# Patient Record
Sex: Female | Born: 1975 | Race: Black or African American | Hispanic: No | Marital: Married | Smoking: Never smoker
Health system: Southern US, Community
[De-identification: ages and names within clinical notes are randomized; demographics above are authoritative.]

## PROBLEM LIST (undated history)

## (undated) DIAGNOSIS — K529 Noninfective gastroenteritis and colitis, unspecified: Secondary | ICD-10-CM

## (undated) HISTORY — DX: Noninfective gastroenteritis and colitis, unspecified: K52.9

---

## 2013-03-12 ENCOUNTER — Other Ambulatory Visit: Payer: Self-pay | Admitting: Obstetrics and Gynecology

## 2013-03-12 DIAGNOSIS — R928 Other abnormal and inconclusive findings on diagnostic imaging of breast: Secondary | ICD-10-CM

## 2013-03-27 ENCOUNTER — Ambulatory Visit
Admission: RE | Admit: 2013-03-27 | Discharge: 2013-03-27 | Disposition: A | Discharge status: Home / Self Care | Payer: 59 | Source: Ambulatory Visit | Attending: Obstetrics and Gynecology | Admitting: Obstetrics and Gynecology

## 2013-03-27 DIAGNOSIS — R928 Other abnormal and inconclusive findings on diagnostic imaging of breast: Secondary | ICD-10-CM

## 2013-04-08 ENCOUNTER — Other Ambulatory Visit: Payer: Self-pay | Admitting: Obstetrics and Gynecology

## 2013-04-08 DIAGNOSIS — R922 Inconclusive mammogram: Secondary | ICD-10-CM

## 2013-10-26 ENCOUNTER — Emergency Department (HOSPITAL_COMMUNITY): Payer: 59

## 2013-10-26 ENCOUNTER — Emergency Department (HOSPITAL_COMMUNITY)
Admission: EM | Admit: 2013-10-26 | Discharge: 2013-10-26 | Discharge status: Against Medical Advice | Payer: 59 | Attending: Emergency Medicine | Admitting: Emergency Medicine

## 2013-10-26 DIAGNOSIS — R1012 Left upper quadrant pain: Secondary | ICD-10-CM

## 2013-10-26 DIAGNOSIS — S3981XA Other specified injuries of abdomen, initial encounter: Secondary | ICD-10-CM | POA: Insufficient documentation

## 2013-10-26 DIAGNOSIS — Y9389 Activity, other specified: Secondary | ICD-10-CM | POA: Insufficient documentation

## 2013-10-26 DIAGNOSIS — M549 Dorsalgia, unspecified: Secondary | ICD-10-CM

## 2013-10-26 DIAGNOSIS — IMO0002 Reserved for concepts with insufficient information to code with codable children: Secondary | ICD-10-CM | POA: Insufficient documentation

## 2013-10-26 DIAGNOSIS — S298XXA Other specified injuries of thorax, initial encounter: Secondary | ICD-10-CM | POA: Insufficient documentation

## 2013-10-26 DIAGNOSIS — Y92410 Unspecified street and highway as the place of occurrence of the external cause: Secondary | ICD-10-CM | POA: Insufficient documentation

## 2013-10-26 LAB — CBC
HCT: 34.9 % — ABNORMAL LOW (ref 36.0–46.0)
HEMOGLOBIN: 11.8 g/dL — AB (ref 12.0–15.0)
MCH: 26.2 pg (ref 26.0–34.0)
MCHC: 33.8 g/dL (ref 30.0–36.0)
MCV: 77.6 fL — AB (ref 78.0–100.0)
Platelets: 289 10*3/uL (ref 150–400)
RBC: 4.5 MIL/uL (ref 3.87–5.11)
RDW: 13.5 % (ref 11.5–15.5)
WBC: 6.9 10*3/uL (ref 4.0–10.5)
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
## 2013-10-26 NOTE — ED Notes (Signed)
Lab unable to run Lab tube, went in to draw another tube, patient refused, wanting to leave AMA. PA aware
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
## 2013-10-30 NOTE — ED Provider Notes (Signed)
Medical screening examination/treatment/procedure(s) were performed by non-physician practitioner and as supervising physician I was immediately available for consultation/collaboration.   Shoshanna Mcquitty M Lunna Vogelgesang, MD 10/30/13 2215 

## 2013-10-30 NOTE — ED Provider Notes (Signed)
Medical screening examination/treatment/procedure(s) were performed by non-physician practitioner and as supervising physician I was immediately available for consultation/collaboration.   Hurman HornJohn M Shritha Bresee, MD 10/30/13 2215

## 2014-02-24 ENCOUNTER — Emergency Department (HOSPITAL_COMMUNITY)
Admission: EM | Admit: 2014-02-24 | Discharge: 2014-02-24 | Disposition: A | Discharge status: Home / Self Care | Payer: 59 | Attending: Emergency Medicine | Admitting: Emergency Medicine

## 2014-02-24 ENCOUNTER — Emergency Department (HOSPITAL_COMMUNITY): Payer: 59

## 2014-02-24 ENCOUNTER — Encounter (HOSPITAL_COMMUNITY): Payer: Self-pay | Admitting: Emergency Medicine

## 2014-02-24 DIAGNOSIS — Z3202 Encounter for pregnancy test, result negative: Secondary | ICD-10-CM | POA: Insufficient documentation

## 2014-02-24 DIAGNOSIS — K529 Noninfective gastroenteritis and colitis, unspecified: Secondary | ICD-10-CM

## 2014-02-24 DIAGNOSIS — R109 Unspecified abdominal pain: Secondary | ICD-10-CM | POA: Insufficient documentation

## 2014-02-24 DIAGNOSIS — K5289 Other specified noninfective gastroenteritis and colitis: Secondary | ICD-10-CM | POA: Diagnosis not present

## 2014-02-24 LAB — COMPREHENSIVE METABOLIC PANEL
ALT: 14 U/L (ref 0–35)
AST: 29 U/L (ref 0–37)
Albumin: 4.3 g/dL (ref 3.5–5.2)
Alkaline Phosphatase: 43 U/L (ref 39–117)
Anion gap: 13 (ref 5–15)
BILIRUBIN TOTAL: 0.5 mg/dL (ref 0.3–1.2)
BUN: 12 mg/dL (ref 6–23)
CHLORIDE: 103 meq/L (ref 96–112)
CO2: 23 meq/L (ref 19–32)
Calcium: 9.4 mg/dL (ref 8.4–10.5)
Creatinine, Ser: 0.74 mg/dL (ref 0.50–1.10)
Glucose, Bld: 120 mg/dL — ABNORMAL HIGH (ref 70–99)
Potassium: 5.1 mEq/L (ref 3.7–5.3)
Sodium: 139 mEq/L (ref 137–147)
Total Protein: 7.4 g/dL (ref 6.0–8.3)

## 2014-02-24 LAB — URINALYSIS, ROUTINE W REFLEX MICROSCOPIC
BILIRUBIN URINE: NEGATIVE
GLUCOSE, UA: NEGATIVE mg/dL
HGB URINE DIPSTICK: NEGATIVE
KETONES UR: NEGATIVE mg/dL
Leukocytes, UA: NEGATIVE
Nitrite: NEGATIVE
PROTEIN: NEGATIVE mg/dL
Specific Gravity, Urine: 1.01 (ref 1.005–1.030)
Urobilinogen, UA: 0.2 mg/dL (ref 0.0–1.0)
pH: 7.5 (ref 5.0–8.0)

## 2014-02-24 LAB — CBC WITH DIFFERENTIAL/PLATELET
Basophils Absolute: 0 10*3/uL (ref 0.0–0.1)
Basophils Relative: 0 % (ref 0–1)
EOS PCT: 0 % (ref 0–5)
Eosinophils Absolute: 0 10*3/uL (ref 0.0–0.7)
HEMATOCRIT: 36.4 % (ref 36.0–46.0)
Hemoglobin: 12 g/dL (ref 12.0–15.0)
LYMPHS PCT: 12 % (ref 12–46)
Lymphs Abs: 1.1 10*3/uL (ref 0.7–4.0)
MCH: 25.5 pg — ABNORMAL LOW (ref 26.0–34.0)
MCHC: 33 g/dL (ref 30.0–36.0)
MCV: 77.3 fL — AB (ref 78.0–100.0)
MONO ABS: 0.3 10*3/uL (ref 0.1–1.0)
Monocytes Relative: 4 % (ref 3–12)
NEUTROS ABS: 7.8 10*3/uL — AB (ref 1.7–7.7)
Neutrophils Relative %: 84 % — ABNORMAL HIGH (ref 43–77)
Platelets: 283 10*3/uL (ref 150–400)
RBC: 4.71 MIL/uL (ref 3.87–5.11)
RDW: 13.5 % (ref 11.5–15.5)
WBC: 9.3 10*3/uL (ref 4.0–10.5)

## 2014-02-24 LAB — I-STAT TROPONIN, ED: Troponin i, poc: 0 ng/mL (ref 0.00–0.08)

## 2014-02-24 LAB — PREGNANCY, URINE: Preg Test, Ur: NEGATIVE

## 2014-02-24 LAB — LIPASE, BLOOD: LIPASE: 23 U/L (ref 11–59)

## 2014-02-24 MED ORDER — OXYCODONE-ACETAMINOPHEN 5-325 MG PO TABS: 1.0000 | ORAL_TABLET | ORAL | Status: DC | PRN

## 2014-02-24 MED ORDER — IOHEXOL 300 MG/ML  SOLN
50.0000 mL | Freq: Once | INTRAMUSCULAR | Status: AC | PRN
  Administered 2014-02-24: 50 mL via ORAL

## 2014-02-24 MED ORDER — METRONIDAZOLE 500 MG PO TABS: 500.0000 mg | ORAL_TABLET | Freq: Two times a day (BID) | ORAL | Status: DC

## 2014-02-24 MED ORDER — IOHEXOL 300 MG/ML  SOLN
100.0000 mL | Freq: Once | INTRAMUSCULAR | Status: AC | PRN
  Administered 2014-02-24: 100 mL via INTRAVENOUS

## 2014-02-24 MED ORDER — ONDANSETRON 8 MG PO TBDP: 8.0000 mg | ORAL_TABLET | Freq: Three times a day (TID) | ORAL | Status: DC | PRN

## 2014-02-24 MED ORDER — ONDANSETRON HCL 4 MG/2ML IJ SOLN
4.0000 mg | Freq: Once | INTRAMUSCULAR | Status: AC
  Administered 2014-02-24: 4 mg via INTRAVENOUS
  Filled 2014-02-24: qty 2

## 2014-02-24 MED ORDER — MORPHINE SULFATE 4 MG/ML IJ SOLN
6.0000 mg | Freq: Once | INTRAMUSCULAR | Status: AC
  Administered 2014-02-24: 6 mg via INTRAVENOUS
  Filled 2014-02-24: qty 2

## 2014-02-24 NOTE — ED Notes (Signed)
Pt c/o abd pain "like i have to use the bath room but nothing comes out".  Pt states she had small BM this morning, that was formed and loose.  Pt been having the problems off and on for a while.

## 2014-02-24 NOTE — ED Provider Notes (Signed)
CSN: [id number]     Arrival date & time 02/24/14  0801 History   First MD Initiated Contact with Patient 02/24/14 0809     Chief Complaint  Patient presents with  . Abdominal Pain      HPI Patient presents to the emergency department complaining of intermittent abdominal pain over the past 6 months.  She states that her pain comes on quickly and feels like a crampy-type pain is located mainly in the periumbilical and left-sided regions of her abdomen.  It is sometimes associated with nausea and vomiting.  She's had an abdominal hysterectomy but reports no other abdominal surgeries.  She states this usually occurs more in the morning time.  Her husband reports that she can't be thought of her diet several months ago and this seemed to help for a little while but now her pain is resolved.  No prior history of gallstones.  Denies fevers or chills.  She is in her normal state of health this morning when she awoke and the pain became rather severe shortly after awakening.  She denies urinary symptoms.  She feels like she is somewhat constipated and a stroke with constipation before.  Denies chest pain or shortness breath.  Denies back pain.  No pelvic complaints.  Pain is moderate to severe in severity at this time.  During the time of the history the pain seemed to increase in severity while we are talking and was focused in the left side of her abdomen.   History reviewed. No pertinent past medical history. History reviewed. No pertinent past surgical history. No family history on file. History  Substance Use Topics  . Smoking status: Never Smoker   . Smokeless tobacco: Not on file  . Alcohol Use: No   OB History   Grav Para Term Preterm Abortions TAB SAB Ect Mult Living                 Review of Systems  All other systems reviewed and are negative.     Allergies  Review of patient's allergies indicates no known allergies.  Home Medications   Prior to Admission medications   Not  on File   BP 125/80  Pulse 74  Temp(Src) 97.9 F (36.6 C) (Oral)  Resp 19  SpO2 100%  LMP 02/17/2014 Physical Exam  Nursing note and vitals reviewed. Constitutional: She is oriented to person, place, and time. She appears well-developed and well-nourished. No distress.  Uncomfortable appearing  HENT:  Head: Normocephalic and atraumatic.  Eyes: EOM are normal.  Neck: Normal range of motion.  Cardiovascular: Normal rate, regular rhythm and normal heart sounds.   Pulmonary/Chest: Effort normal and breath sounds normal.  Abdominal: Soft. She exhibits no distension.  Mild left-sided abdominal tenderness without guarding or rebound.  No right upper quadrant tenderness on examination.  No right lower quadrant tenderness on examination.  Musculoskeletal: Normal range of motion.  Neurological: She is alert and oriented to person, place, and time.  Skin: Skin is warm and dry.  Psychiatric: She has a normal mood and affect. Judgment normal.    ED Course  Procedures (including critical care time) Labs Review Labs Reviewed  CBC WITH DIFFERENTIAL - Abnormal; Notable for the following:    MCV 77.3 (*)    MCH 25.5 (*)    Neutrophils Relative % 84 (*)    Neutro Abs 7.8 (*)    All other components within normal limits  COMPREHENSIVE METABOLIC PANEL - Abnormal; Notable for the following:  Glucose, Bld 120 (*)    All other components within normal limits  LIPASE, BLOOD  URINALYSIS, ROUTINE W REFLEX MICROSCOPIC  PREGNANCY, URINE  I-STAT TROPOININ, ED    Imaging Review Ct Abdomen Pelvis W Contrast  02/24/2014   CLINICAL DATA:  Abdominal pain intermittent 6 months. Worsening today  EXAM: EXAM  CT ABDOMEN AND PELVIS WITH CONTRAST  TECHNIQUE: Multidetector CT imaging of the abdomen and pelvis was performed using the standard protocol following bolus administration of intravenous contrast.  CONTRAST:  50mL OMNIPAQUE IOHEXOL 300 MG/ML SOLN, OMNIPAQUE IOHEXOL 300 MG/ML SOLN  COMPARISON:   None.  FINDINGS: Lung bases are clear.  No pericardial fluid.  There several sub cm low-attenuation lesions the dome the liver too small to characterize likely represent small cysts or hemangiomas. Portal veins are patent. Gallbladder is normal.  The pancreas, spleen, adrenal glands, kidneys are normal.  The stomach, small bowel, and cecum are normal. Appendix is normal. There is mild bowel wall thickening / edema of the descending colon and sigmoid colon. For example on axial image 50, series 2 the bowel wall of descending colon is thickened at 3 mm. Mildly thickened sigmoid colon is seen on image 61 series 5. There is no free fluid matter pelvis.  Abdominal aorta is normal caliber. No retroperitoneal periportal lymphadenopathy  No free fluid the pelvis. The uterus and ovaries normal. No pelvic lymphadenopathy the. No aggressive osseous lesion.  IMPRESSION: 1. Mild bowel wall thickening / edema in the descending colon and sigmoid colon is nonspecific but may represent mild colitis with differential including inflammatory bowel disease or infectious or inflammatory colitis. 2. Normal appendix and gallbladder. 3. Small hepatic hypodensities cannot be fully characterized but likely represent benign lesions.   Electronically Signed   By: Genevive Bi M.D.   On: 02/24/2014 10:03     EKG Interpretation None      MDM   Final diagnoses:  Colitis    10:35 AM Patient feels better at this time.  CT scan appears to have signs of colitis.  Patient be placed on Flagyl.  Outpatient PCP followup.  Referral to gastroenterology.  She will likely need colonoscopy in the near future to further evaluate.  She understands to return to the ER for new or worsening symptoms    Lyanne Co, MD 02/24/14 1035

## 2014-02-24 NOTE — ED Notes (Signed)
Pt reports intermittent lower abdominal pain for 6 months starting/worsening again this am around 0530. Pt reports normal BM this morning, one episode of vomiting. Pt denies blood in vomit or stool. Pt denies GU complaint. Pt denies upper abdominal pain, CP, or SOB.

## 2014-02-24 NOTE — Discharge Instructions (Signed)

## 2014-02-25 ENCOUNTER — Encounter: Payer: Self-pay | Admitting: Gastroenterology

## 2014-04-23 ENCOUNTER — Telehealth: Payer: Self-pay

## 2014-04-23 ENCOUNTER — Encounter: Payer: Self-pay | Admitting: Gastroenterology

## 2014-04-23 ENCOUNTER — Ambulatory Visit (INDEPENDENT_AMBULATORY_CARE_PROVIDER_SITE_OTHER): Payer: 59 | Admitting: Gastroenterology

## 2014-04-23 VITALS — BP 118/70 | HR 74 | Ht 67.0 in | Wt 197.2 lb

## 2014-04-23 DIAGNOSIS — R933 Abnormal findings on diagnostic imaging of other parts of digestive tract: Secondary | ICD-10-CM

## 2014-04-23 DIAGNOSIS — R109 Unspecified abdominal pain: Secondary | ICD-10-CM

## 2014-04-23 DIAGNOSIS — K5900 Constipation, unspecified: Secondary | ICD-10-CM

## 2014-04-23 MED ORDER — MOVIPREP 100 G PO SOLR: ORAL | Status: DC

## 2014-04-23 MED ORDER — HYOSCYAMINE SULFATE 0.125 MG PO TABS: ORAL_TABLET | ORAL | Status: AC

## 2014-04-23 NOTE — Patient Instructions (Signed)
You have been scheduled for a colonoscopy. Please follow written instructions given to you at your visit today.  Please pick up your prep kit at the pharmacy within the next 1-3 days. If you use inhalers (even only as needed), please bring them with you on the day of your procedure. Your physician has requested that you go to www.startemmi.com and enter the access code given to you at your visit today. This web site gives a general overview about your procedure. However, you should still follow specific instructions given to you by our office regarding your preparation for the procedure.  Use your miralax one to two times a day.   I appreciate the opportunity to care for you.

## 2014-04-23 NOTE — Progress Notes (Signed)
    History of Present Illness: This is a 38 year old female who relates mild constipation and episodic generalized crampy abdominal pain intermittently for the past several months. She had one episode that was quite severe and presented to the ED on August 31. Blood work and an abdominal/pelvic CT scan were performed and were unremarkable except for microcytosis and the subtle CT findings outlined below. Denies weight loss, diarrhea, change in stool caliber, melena, hematochezia, nausea, vomiting, dysphagia, reflux symptoms, chest pain.  IMPRESSION:  1. Mild bowel wall thickening / edema in the descending colon and sigmoid colon is nonspecific but may represent mild colitis with differential including inflammatory bowel disease or infectious or inflammatory colitis.  2. Normal appendix and gallbladder.  3. Small hepatic hypodensities cannot be fully characterized but likely represent benign lesions.    Review of Systems: Pertinent positive and negative review of systems were noted in the above HPI section. All other review of systems were otherwise negative.   Physical Exam: General: Well developed , well nourished, no acute distress Head: Normocephalic and atraumatic Eyes:  sclerae anicteric, EOMI Ears: Normal auditory acuity Mouth: No deformity or lesions Neck: Supple, no masses or thyromegaly Lungs: Clear throughout to auscultation Heart: Regular rate and rhythm; no murmurs, rubs or bruits Abdomen: Soft, non tender and non distended. No masses, hepatosplenomegaly or hernias noted. Normal Bowel sounds Rectal: Deferred to colonoscopy Musculoskeletal: Symmetrical with no gross deformities  Skin: No lesions on visible extremities Pulses:  Normal pulses noted Extremities: No clubbing, cyanosis, edema or deformities noted Neurological: Alert oriented x 4, grossly nonfocal Cervical Nodes:  No significant cervical adenopathy Inguinal Nodes: No significant inguinal adenopathy Psychological:   Alert and cooperative. Normal mood and affect  Assessment and Recommendations:  1. Episodic generalized abdominal pain associated with constipation. Mildly thickened left colon on CT scan which may be an artifact. 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 Rule out IBD. MiraLAX once or twice daily titrated for adequate bowel movements. Levsin 1-2 every 4 hours as needed for abdominal pain. Maintain a high-fiber diet with adequate daily fluid intake. Schedule colonoscopy. The risks, benefits, and alternatives to colonoscopy with possible biopsy and possible polypectomy were discussed with the patient and they consent to proceed.

## 2014-04-23 NOTE — Telephone Encounter (Signed)
Informed patient that rx sent in for her abdominal pain, confirmed pharmacy.

## 2014-04-23 NOTE — Telephone Encounter (Signed)
Message copied by SwazilandJORDAN, Nishaan Stanke E on Wed Apr 23, 2014  5:22 PM ------      Message from: Claudette HeadSTARK, MALCOLM T      Created: Wed Apr 23, 2014  5:05 PM       Levsin 0.125 mg po 1-2 every 4 hours as needed for abdominal pain. ------

## 2014-04-28 ENCOUNTER — Encounter: Payer: Self-pay | Admitting: Gastroenterology

## 2014-04-28 NOTE — Progress Notes (Signed)
7 pages of FMLA paperwork has been sent to [employer] to be filled out.

## 2014-04-30 ENCOUNTER — Telehealth: Payer: Self-pay | Admitting: Gastroenterology

## 2014-04-30 NOTE — Telephone Encounter (Signed)
Patient is provided a [employer] voucher she will come and pick that up.  She is advised that she will need to have the FMLA paperwork done with her primary care

## 2014-05-05 ENCOUNTER — Encounter: Payer: Self-pay | Admitting: Gastroenterology

## 2014-05-05 NOTE — Progress Notes (Signed)
We have received 7 pages of FMLA forms from [employer] on the above patient. These forms have been sent to [employer].

## 2014-05-07 ENCOUNTER — Telehealth: Payer: Self-pay | Admitting: Gastroenterology

## 2014-05-07 NOTE — Telephone Encounter (Signed)
I have explained that the paperwork was [facility] on Monday 05/05/14 and that once they do their part they will send it back up to Dr. Russella DarStark to review and sign

## 2014-05-08 ENCOUNTER — Encounter: Payer: Self-pay | Admitting: Gastroenterology

## 2014-05-08 ENCOUNTER — Ambulatory Visit (AMBULATORY_SURGERY_CENTER): Payer: 59 | Admitting: Gastroenterology

## 2014-05-08 VITALS — BP 140/80 | HR 63 | Temp 98.8°F | Resp 18 | Ht 67.0 in | Wt 197.0 lb

## 2014-05-08 DIAGNOSIS — R933 Abnormal findings on diagnostic imaging of other parts of digestive tract: Secondary | ICD-10-CM

## 2014-05-08 MED ORDER — SODIUM CHLORIDE 0.9 % IV SOLN: 500.0000 mL | INTRAVENOUS | Status: DC

## 2014-05-08 NOTE — Patient Instructions (Addendum)
YOU HAD AN ENDOSCOPIC PROCEDURE TODAY AT [facility]: Refer to the procedure report that was given to you for any specific questions about what was found during the examination.  If the procedure report does not answer your questions, please call your gastroenterologist to clarify.  If you requested that your care partner not be given the details of your procedure findings, then the procedure report has been included in a sealed envelope for you to review at your convenience later.  YOU SHOULD EXPECT: Some feelings of bloating in the abdomen. Passage of more gas than usual.  Walking can help get rid of the air that was put into your GI tract during the procedure and reduce the bloating. If you had a lower endoscopy (such as a colonoscopy or flexible sigmoidoscopy) you may notice spotting of blood in your stool or on the toilet paper. If you underwent a bowel prep for your procedure, then you may not have a normal bowel movement for a few days.  DIET: Your first meal following the procedure should be a light meal and then it is ok to progress to your normal diet.  A half-sandwich or bowl of soup is an example of a good first meal.  Heavy or fried foods are harder to digest and may make you feel nauseous or bloated.  Likewise meals heavy in dairy and vegetables can cause extra gas to form and this can also increase the bloating.  Drink plenty of fluids but you should avoid alcoholic beverages for 24 hours.Try to increase the fiber in your diet.  ACTIVITY: Your care partner should take you home directly after the procedure.  You should plan to take it easy, moving slowly for the rest of the day.  You can resume normal activity the day after the procedure however you should NOT DRIVE or use heavy machinery for 24 hours (because of the sedation medicines used during the test).    SYMPTOMS TO REPORT IMMEDIATELY: A gastroenterologist can be reached at any hour.  During normal business hours, 8:30  AM to 5:00 PM Monday through Friday, call [phone].  After hours and on weekends, please call the GI answering service at [phone] who will take a message and have the physician on call contact you.   Following lower endoscopy (colonoscopy or flexible sigmoidoscopy):  Excessive amounts of blood in the stool  Significant tenderness or worsening of abdominal pains  Swelling of the abdomen that is new, acute  Fever of 100F or higher  FOLLOW UP: If any biopsies were taken you will be contacted by phone or by letter within the next 1-3 weeks.  Call your gastroenterologist if you have not heard about the biopsies in 3 weeks.  Our staff will call the home number listed on your records the next business day following your procedure to check on you and address any questions or concerns that you may have at that time regarding the information given to you following your procedure. This is a courtesy call and so if there is no answer at the home number and we have not heard from you through the emergency physician on call, we will assume that you have returned to your regular daily activities without incident.  SIGNATURES/CONFIDENTIALITY: You and/or your care partner have signed paperwork which will be entered into your electronic medical record.  These signatures attest to the fact that that the information above on your After Visit Summary has been reviewed and is understood.  Full responsibility of the confidentiality of this discharge information lies with you and/or your care-partner.  Please, read the handouts given to you by your recovery room nurse. Be sure to continue your miralax 1-2 times per day.

## 2014-05-08 NOTE — Progress Notes (Signed)
Report to PACU, RN, vss, BBS= Clear.  

## 2014-05-08 NOTE — Op Note (Signed)
[facility] [address] [place], [id number]   COLONOSCOPY PROCEDURE REPORT  PATIENT: Holly Ramos, Holly Ramos  MR#: [id number] BIRTHDATE: July 24, 1975 , 38  yrs. old GENDER: female ENDOSCOPIST: Meryl DareMalcolm T Cortavius Montesinos, MD, [facility] REFERRED XB:JYNWGNFABY:Holwerda, Scott PROCEDURE DATE:  05/08/2014 PROCEDURE:   Colonoscopy, diagnostic First Screening Colonoscopy - Avg.  risk and is 50 yrs.  old or older - No.  Prior Negative Screening - Now for repeat screening. N/A  History of Adenoma - Now for follow-up colonoscopy & has been > or = to 3 yrs.  N/A  Polyps Removed Today? No.  Polyps Removed Today? No.  Recommend repeat exam, <10 yrs? Polyps Removed Today? No.  Recommend repeat exam, <10 yrs? No. ASA CLASS:   Class II INDICATIONS:an abnormal CT. MEDICATIONS: Monitored anesthesia care and Propofol 270 mg IV DESCRIPTION OF PROCEDURE:   After the risks benefits and alternatives of the procedure were thoroughly explained, informed consent was obtained.  The digital rectal exam revealed no abnormalities of the rectum.   The LB [id number]  endoscope was introduced through the anus and advanced to the cecum, which was identified by both the appendix and ileocecal valve. No adverse events experienced.   The quality of the prep was excellent, using MoviPrep  The instrument was then slowly withdrawn as the colon was fully examined.  COLON FINDINGS: There was mild diverticulosis noted in the transverse colon.  There was mild diverticulosis noted in the sigmoid colon with associated colonic spasm, luminal narrowing and muscular hypertrophy. The examination was otherwise normal. Retroflexed views revealed no abnormalities. The time to cecum=3 minutes 31 seconds.  Withdrawal time=7 minutes 39 seconds.  The scope was withdrawn and the procedure completed.  COMPLICATIONS: There were no immediate complications.  ENDOSCOPIC IMPRESSION: 1.   Mild diverticulosis in the transverse colon 2.   Mild  diverticulosis in the sigmoid colon  RECOMMENDATIONS: 1.  High fiber diet with liberal fluid intake. 2.  Continue to follow colorectal cancer screening guidelines for "routine risk" patients with a repeat colonoscopy in 10 years. There is no need for routine, screening FOBT (stool) testing for at least 5 years. 3.  Continue Miralax 1-2 times every day and Levsin as needed eSigned:  Meryl DareMalcolm T Skylier Kretschmer, MD, [facility] 05/08/2014 10:41 AM

## 2014-05-09 ENCOUNTER — Telehealth: Payer: Self-pay | Admitting: *Deleted

## 2014-05-09 NOTE — Telephone Encounter (Signed)
  Follow up Call-  Call back number 05/08/2014  Post procedure Call Back phone  # [phone]  Permission to leave phone message Yes     Patient questions:  Do you have a fever, pain , or abdominal swelling? No. Pain Score  0 *  Have you tolerated food without any problems? Yes.    Have you been able to return to your normal activities? Yes.    Do you have any questions about your discharge instructions: Diet   No. Medications  No. Follow up visit  No.  Do you have questions or concerns about your Care? No.  Actions: * If pain score is 4 or above: No action needed, pain <4.  Pt. Had question about FMLA?[employer]. I advised her to call third floor as she told me she had been dealing with them about this.

## 2014-05-12 ENCOUNTER — Encounter: Payer: Self-pay | Admitting: Gastroenterology

## 2014-05-12 NOTE — Telephone Encounter (Signed)
Error

## 2014-05-13 ENCOUNTER — Telehealth: Payer: Self-pay | Admitting: Gastroenterology

## 2014-05-13 NOTE — Telephone Encounter (Signed)
I explained to the patient that Dr. Russella DarStark is unwilling to grant FMLA for diverticulosis.  She repeatedly reports that she has abdominal pain and cramps and she feels it is associated with diverticulosis and she wants Dr. Russella DarStark to grant her intermittent FMLA for this condition.  I explained again that this is not an indicated condition for FMLA.  She is upset that Dr. Russella DarStark was unwilling to grant her FMLA for 05/07/14-05/12/14 for her colonoscopy.  She is advised we are happy to provide a work note for 05/08/14 the day of the procedure, but not able to grant FMLA.  She says she has a work note, but wants him to fill out FMLA "to protect her job".  I explained again that Dr. Russella DarStark has denied her request and her paperwork has been returned to [employer].  She then hung up on me.

## 2014-08-19 ENCOUNTER — Emergency Department (HOSPITAL_COMMUNITY)
Admission: EM | Admit: 2014-08-19 | Discharge: 2014-08-19 | Disposition: A | Discharge status: Home / Self Care | Payer: 59 | Attending: Emergency Medicine | Admitting: Emergency Medicine

## 2014-08-19 ENCOUNTER — Ambulatory Visit (INDEPENDENT_AMBULATORY_CARE_PROVIDER_SITE_OTHER): Payer: 59 | Admitting: Cardiology

## 2014-08-19 ENCOUNTER — Encounter: Payer: Self-pay | Admitting: Cardiology

## 2014-08-19 ENCOUNTER — Encounter (HOSPITAL_COMMUNITY): Payer: Self-pay | Admitting: Emergency Medicine

## 2014-08-19 ENCOUNTER — Emergency Department (HOSPITAL_COMMUNITY): Payer: 59

## 2014-08-19 DIAGNOSIS — R0602 Shortness of breath: Secondary | ICD-10-CM

## 2014-08-19 DIAGNOSIS — Z8719 Personal history of other diseases of the digestive system: Secondary | ICD-10-CM | POA: Diagnosis not present

## 2014-08-19 DIAGNOSIS — Z3202 Encounter for pregnancy test, result negative: Secondary | ICD-10-CM | POA: Insufficient documentation

## 2014-08-19 DIAGNOSIS — R0789 Other chest pain: Secondary | ICD-10-CM | POA: Insufficient documentation

## 2014-08-19 DIAGNOSIS — R42 Dizziness and giddiness: Secondary | ICD-10-CM | POA: Diagnosis not present

## 2014-08-19 DIAGNOSIS — R110 Nausea: Secondary | ICD-10-CM | POA: Diagnosis not present

## 2014-08-19 DIAGNOSIS — R079 Chest pain, unspecified: Secondary | ICD-10-CM

## 2014-08-19 LAB — COMPREHENSIVE METABOLIC PANEL
ALBUMIN: 4.1 g/dL (ref 3.5–5.2)
ALK PHOS: 44 U/L (ref 39–117)
ALT: 16 U/L (ref 0–35)
AST: 18 U/L (ref 0–37)
Anion gap: 4 — ABNORMAL LOW (ref 5–15)
BUN: 13 mg/dL (ref 6–23)
CO2: 24 mmol/L (ref 19–32)
Calcium: 8.9 mg/dL (ref 8.4–10.5)
Chloride: 110 mmol/L (ref 96–112)
Creatinine, Ser: 0.73 mg/dL (ref 0.50–1.10)
GFR calc Af Amer: >90 mL/min (ref >90)
GFR calc non Af Amer: >90 mL/min (ref >90)
Glucose, Bld: 107 mg/dL — ABNORMAL HIGH (ref 70–99)
POTASSIUM: 3.5 mmol/L (ref 3.5–5.1)
SODIUM: 138 mmol/L (ref 135–145)
TOTAL PROTEIN: 5.9 g/dL — AB (ref 6.0–8.3)
Total Bilirubin: 0.7 mg/dL (ref 0.3–1.2)

## 2014-08-19 LAB — CBC
HCT: 34 % — ABNORMAL LOW (ref 36.0–46.0)
HEMOGLOBIN: 10.9 g/dL — AB (ref 12.0–15.0)
MCH: 24.9 pg — AB (ref 26.0–34.0)
MCHC: 32.1 g/dL (ref 30.0–36.0)
MCV: 77.6 fL — ABNORMAL LOW (ref 78.0–100.0)
PLATELETS: 253 10*3/uL (ref 150–400)
RBC: 4.38 MIL/uL (ref 3.87–5.11)
RDW: 13.3 % (ref 11.5–15.5)
WBC: 6 10*3/uL (ref 4.0–10.5)

## 2014-08-19 LAB — I-STAT TROPONIN, ED: TROPONIN I, POC: 0 ng/mL (ref 0.00–0.08)

## 2014-08-19 LAB — POC URINE PREG, ED: Preg Test, Ur: NEGATIVE

## 2014-08-19 LAB — LIPASE, BLOOD: Lipase: 26 U/L (ref 11–59)

## 2014-08-19 MED ORDER — HYDROMORPHONE HCL 1 MG/ML IJ SOLN
0.5000 mg | Freq: Once | INTRAMUSCULAR | Status: AC
  Administered 2014-08-19: 0.5 mg via INTRAVENOUS
  Filled 2014-08-19: qty 1

## 2014-08-19 MED ORDER — KETOROLAC TROMETHAMINE 30 MG/ML IJ SOLN
30.0000 mg | Freq: Once | INTRAMUSCULAR | Status: DC
  Filled 2014-08-19: qty 1

## 2014-08-19 MED ORDER — ASPIRIN 81 MG PO CHEW
324.0000 mg | CHEWABLE_TABLET | Freq: Once | ORAL | Status: AC
  Administered 2014-08-19: 324 mg via ORAL

## 2014-08-19 NOTE — Discharge Instructions (Signed)

## 2014-08-19 NOTE — Progress Notes (Signed)
CARDIOLOGY CONSULT NOTE   Patient ID: Holly Ramos MRN: [id number] DOB/AGE: Mar 28, 1976 39 y.o.  Date: 08/19/2014  Primary Physician   Alysia Penna, MD Primary Cardiologist   New, Dr. Mayford Knife Reason for Consultation   Chest pain  HPI:  39 yo female w/ no previous cardiac issues was not feeling well this am. She exercised for about 20 minutes, doing a variety of exercises, had a smoothie and came to work.  When at work, about 8:30 am, she had onset of sharp, burning left-sided chest pain. She was not significantly exerting herself. She went to the restroom, but the symptoms persisted. Colleagues realized she was in distress (cardiology office) and came to her aid.  She was weak, complaining of sharp, left chest pain, worse with deep inspiration and palpation. Worse with lying flat, better sitting up and leaning forward.The pain made it hard to breathe. She was not nauseated at first, but vomited later. She felt a little sweaty and also had some chills.   Her HR was 102, BP 130/82, and her oxygen saturation was 99% on room air. She was afebrile. She was assisted to a chair. She is not on birth control, does not smoke. She has been on antibiotics for a tooth abscess, otherwise no acute medical problems.   She was seen by the cardiologist and given 4 baby ASA. An ECG was performed and reviewed by Dr. Mayford Knife. There is no STEMI or acute ischemic changes. No diffuse ST elevation or PR depression.  She is still having pain, still a 5/10.  Past Medical History  Diagnosis Date  . Colitis      Past Surgical History  Procedure Laterality Date  . Cesarean section      No Known Allergies I have reviewed the patient's current medications  Prior to Admission medications   Medication Sig Start Date End Date Taking? Authorizing Provider  hyoscyamine (LEVSIN) 0.125 MG tablet Take 1-2 tablets every 4 hours as needed for abdominal pain 04/23/14   Meryl Dare, MD     History    Social History  . Marital Status: Married    Spouse Name: N/A  . Number of Children: N/A  . Years of Education: N/A   Occupational History  . [employer]    Social History Main Topics  . Smoking status: Never Smoker   . Smokeless tobacco: Not on file  . Alcohol Use: No  . Drug Use: No  . Sexual Activity: Not on file   Other Topics Concern  . Not on file   Social History Narrative    Family Status  Relation Status Death Age  . Father Alive    Family History  Problem Relation Age of Onset  . Breast cancer Mother      ROS:  Full 14 point review of systems complete and found to be negative unless listed above.  Physical Exam:  98.4, 130/82, 85, 99% 2L General: Well developed, well nourished, female in mild-moderate distress Head: Eyes PERRLA, No xanthomas.   Normocephalic and atraumatic, oropharynx without edema or exudate. Dentition: fair Lungs: clear bilaterally Heart: HRRR S1 S2, no rub/gallop, no murmur. pulses are 2+ all 4 extrem.   Neck: No carotid bruits. No lymphadenopathy.  JVD not elevated Abdomen: Bowel sounds present, abdomen soft and she is tender in the LUQ, without masses or hernias noted. Msk:  No spine or cva tenderness. No weakness, no joint deformities or effusions. Extremities: No clubbing or  cyanosis.  edema.  Neuro: Alert and oriented X 3. No focal deficits noted. Psych:  Good affect, responds appropriately Skin: No rashes or lesions noted.  ECG:  SR, no acute ischemic changes  ASSESSMENT AND PLAN:   The patient was seen today by Dr. Mayford Knifeurner, the patient evaluated and the data reviewed.  1. Chest pain She is low risk for PE and ECG is not acute. Symptoms are more consistent with inflammatory cause such as pericarditis or costochondritis.   However, with the acute nature of her symptoms, which are ongoing, will transport by EMS to the ER, have ER evaluate her and we will be happy to follow.  Signed: Theodore DemarkRhonda Barrett,  PA-C 08/19/2014 9:19 AM Beeper [phone]  Co-Sign MD  I have personally seen and examined the patient and agree with note as outlined by Theodore Demarkhonda Barrett PA.  Patient was sitting at her desk and reached down to get something and then went back to working and started having severe left sided sharp stabbing chest pain with SOB.  She became dizzy with presyncope.  O2 sats on RA were 99%.  EKG showed nonspecific T wave inversions in V1 and V2.  Chest pain worse with deep breathing and laying supine.  Exam unremarkable.  Diff Dx includes acute PE (no history of clotting disorder, she is not a smoker and not on birth control), acute intercostal muscle strain, acute pericarditis (unlikely given no recent viral syndrome and acute onset).  Coronary ischemia less likely. Will transport to ER by EMS for further evaluation.  Patient given ASA in office and O2 at 2L.    Signed: Armanda Magicraci Turner, MD [facility] 08/19/2014

## 2014-08-19 NOTE — ED Notes (Signed)
[facility] with c/o chest pain after sudden movement at work, pt has worked out this morning, doing burpees, squats, and lifting weights . Pain on palpation on sternum. Painful with breathing.

## 2014-08-19 NOTE — ED Provider Notes (Signed)
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 She had associated shortness of breath. She denies any risk factors for acute coronary syndrome or pulmonary embolism. On exam the patient has clear heart and lung sounds, no murmurs, no peripheral edema, her pain is very positional worse when lays back, better when lays forward, she is not in distress, has normal vital signs, has normal EKG which is nonischemic. She does have her appreciable tenderness over the left side of her chest as well as her left upper quadrant with some guarding. There is no other abdominal tenderness including the right upper quadrant or the right lower quadrant. She has a non-peritoneal abdomen. Discussed plan with patient including labs imaging pain control, she is in agreement, doubt acute coronary syndrome, PE or other cardiovascular pathology.  I saw and evaluated the patient, reviewed the resident's note and I agree with the findings and plan.   I personally interpreted the EKG as well as the resident and agree with the interpretation on the resident's chart.   EKG Interpretation  Date/Time:  Tuesday August 19 2014 09:43:05 EST Ventricular Rate:  77 PR Interval:  156 QRS Duration: 81 QT Interval:  382 QTC Calculation: 432 R Axis:   57 Text Interpretation:  Sinus rhythm Nonspecific ST abnormality Abnormal ekg No old tracing to compare Confirmed by Davinia Riccardi  MD, Cam Harnden ([id number]) on 08/19/2014 9:48:06 AM        Final diagnoses:  Chest wall pain      Vida RollerBrian D Mckenzie Toruno, MD 08/19/14 2123

## 2014-08-19 NOTE — ED Provider Notes (Signed)
CSN: [id number]     Arrival date & time 08/19/14  1324 History   First MD Initiated Contact with Patient 08/19/14 [phone]     Chief Complaint  Patient presents with  . Chest Pain   (Consider location/radiation/quality/duration/timing/severity/associated sxs/prior Treatment) HPI Comments: 39 yo F no significant PMhx presents with CC of chest pain.  Pt states pain began around 8 AM while in a staff meeting.  C/o left sided, sharp, worse with lying down, better with sitting forward and deep breathing, nonradiating, nonpleuritic.  C/o associated SOB, nausea, lightheadedness shortly after time of onset.  Pt was seen by Cardiology group, and evaluated, given ASA and supplemental O2 for comfort, and sent to ED for further evaluation.  Pt denies hx of CAD, HTN, HLD, smoking, DVT/PE, prolonged immobilization, recent surgery, unilateral leg swelling.  Pt does not take birth control.  No other concerns.    The history is provided by the patient. No language interpreter was used.    Past Medical History  Diagnosis Date  . Colitis    Past Surgical History  Procedure Laterality Date  . Cesarean section     Family History  Problem Relation Age of Onset  . Breast cancer Mother    History  Substance Use Topics  . Smoking status: Never Smoker   . Smokeless tobacco: Not on file  . Alcohol Use: No   OB History    No data available     Review of Systems  Constitutional: Negative for fever and chills.  Respiratory: Positive for shortness of breath. Negative for cough.   Cardiovascular: Positive for chest pain. Negative for palpitations and leg swelling.  Gastrointestinal: Negative for nausea, vomiting, abdominal pain and diarrhea.  Genitourinary: Negative for dysuria.  Musculoskeletal: Negative for myalgias.  Skin: Negative for rash.  Neurological: Positive for light-headedness. Negative for dizziness, weakness, numbness and headaches.  Hematological: Negative for adenopathy. Does not bruise/bleed  easily.  All other systems reviewed and are negative.     Allergies  Review of patient's allergies indicates no known allergies.  Home Medications   Prior to Admission medications   Medication Sig Start Date End Date Taking? Authorizing Provider  hyoscyamine (LEVSIN) 0.125 MG tablet Take 1-2 tablets every 4 hours as needed for abdominal pain 04/23/14   Meryl Dare, MD   BP 95/63 mmHg  Pulse 73  Temp(Src) 98.7 F (37.1 C) (Oral)  Resp 20  Ht  (1.676 m)  Wt 192 lb 9.6 oz (87.363 kg)  BMI 31.10 kg/m2  SpO2 100% Physical Exam  Constitutional: She is oriented to person, place, and time. She appears well-developed and well-nourished.  HENT:  Head: Normocephalic and atraumatic.  Right Ear: External ear normal.  Left Ear: External ear normal.  Mouth/Throat: Oropharynx is clear and moist.  Eyes: Conjunctivae and EOM are normal. Pupils are equal, round, and reactive to light.  Neck: Normal range of motion.  Cardiovascular: Normal rate, regular rhythm, normal heart sounds and intact distal pulses.   Pulmonary/Chest: Effort normal and breath sounds normal. No respiratory distress. She has no wheezes. She has no rales. She exhibits tenderness.  Left chest wall TTP.  Abdominal: Soft. Bowel sounds are normal. She exhibits no distension and no mass. There is no tenderness. There is no rebound and no guarding.  Musculoskeletal: Normal range of motion.  Neurological: She is alert and oriented to person, place, and time.  Skin: Skin is warm and dry.  Nursing note and vitals reviewed.   ED Course  Procedures (including critical care time) Labs Review Labs Reviewed  CBC - Abnormal; Notable for the following:    Hemoglobin 10.9 (*)    HCT 34.0 (*)    MCV 77.6 (*)    MCH 24.9 (*)    All other components within normal limits  COMPREHENSIVE METABOLIC PANEL - Abnormal; Notable for the following:    Glucose, Bld 107 (*)    Total Protein 5.9 (*)    Anion gap 4 (*)    All other  components within normal limits  LIPASE, BLOOD  POC URINE PREG, ED  Rosezena SensorI-STAT TROPOININ, ED    Imaging Review Dg Chest 2 View  08/19/2014   CLINICAL DATA:  Left anterior chest pain after working out this morning.  EXAM: CHEST  2 VIEW  COMPARISON:  10/26/2013 acute abdomen series  FINDINGS: Midline trachea.  Normal heart size and mediastinal contours.  Sharp costophrenic angles.  No pneumothorax.  Clear lungs.  Minimal S-shaped thoracolumbar spine curvature.  IMPRESSION: No active cardiopulmonary disease.   Electronically Signed   By: Jeronimo GreavesKyle  Talbot M.D.   On: 08/19/2014 11:15     EKG Interpretation   Date/Time:  Tuesday August 19 2014 09:43:05 EST Ventricular Rate:  77 PR Interval:  156 QRS Duration: 81 QT Interval:  382 QTC Calculation: 432 R Axis:   57 Text Interpretation:  Sinus rhythm Nonspecific ST abnormality Abnormal ekg  No old tracing to compare Confirmed by MILLER  MD, BRIAN ([id number]) on  08/19/2014 9:48:06 AM      MDM   Final diagnoses:  Chest wall pain   39 yo F no significant PMhx presents with CC of chest pain.   Physical exam as above.  VS WNL.  EKG appears nonischemic.  Pt appears slightly anxious.  She has left sided chest tenderness.    CXR WNL.  Troponin 0.00.  CBC, CMP, Lipase WNL.  Pregnancy test negative.   Likely left chest wall pain 2/2 MSK origin.  Unlikely ACS given atypical story, normal EKG, and negative troponin, lack of risk factors, young age.  Wells, and PERC negative, unlikely PE.  Pt given 0.5 mg dilaudid with improvement in symptoms on reevaluation.  I spoke with pt's PCP, who states he would be able to fit her in tomorrow for reevaluation.   D/c home in good condition.  F/u with PCP in 1 day.  Recommend supportive care, NSAIDs for pain.  Return precautions given.  Pt understands and agrees with plan.   Jon GillsWebb, Darek Eifler   Discussed pt with my attending Dr. Hyacinth MeekerMiller.    Jon GillsZach Kiaira Pointer, MD 08/19/14 1551  Vida RollerBrian D Miller, MD 08/19/14 2123

## 2016-10-06 IMAGING — CR DG CHEST 2V
2 series · 2 of 2 positions shown · non-contrast
Comparison: 10/26/2013 acute abdomen series

CLINICAL DATA: Left anterior chest pain after working out this
morning.

EXAM:
CHEST  2 VIEW

[w chest lat]
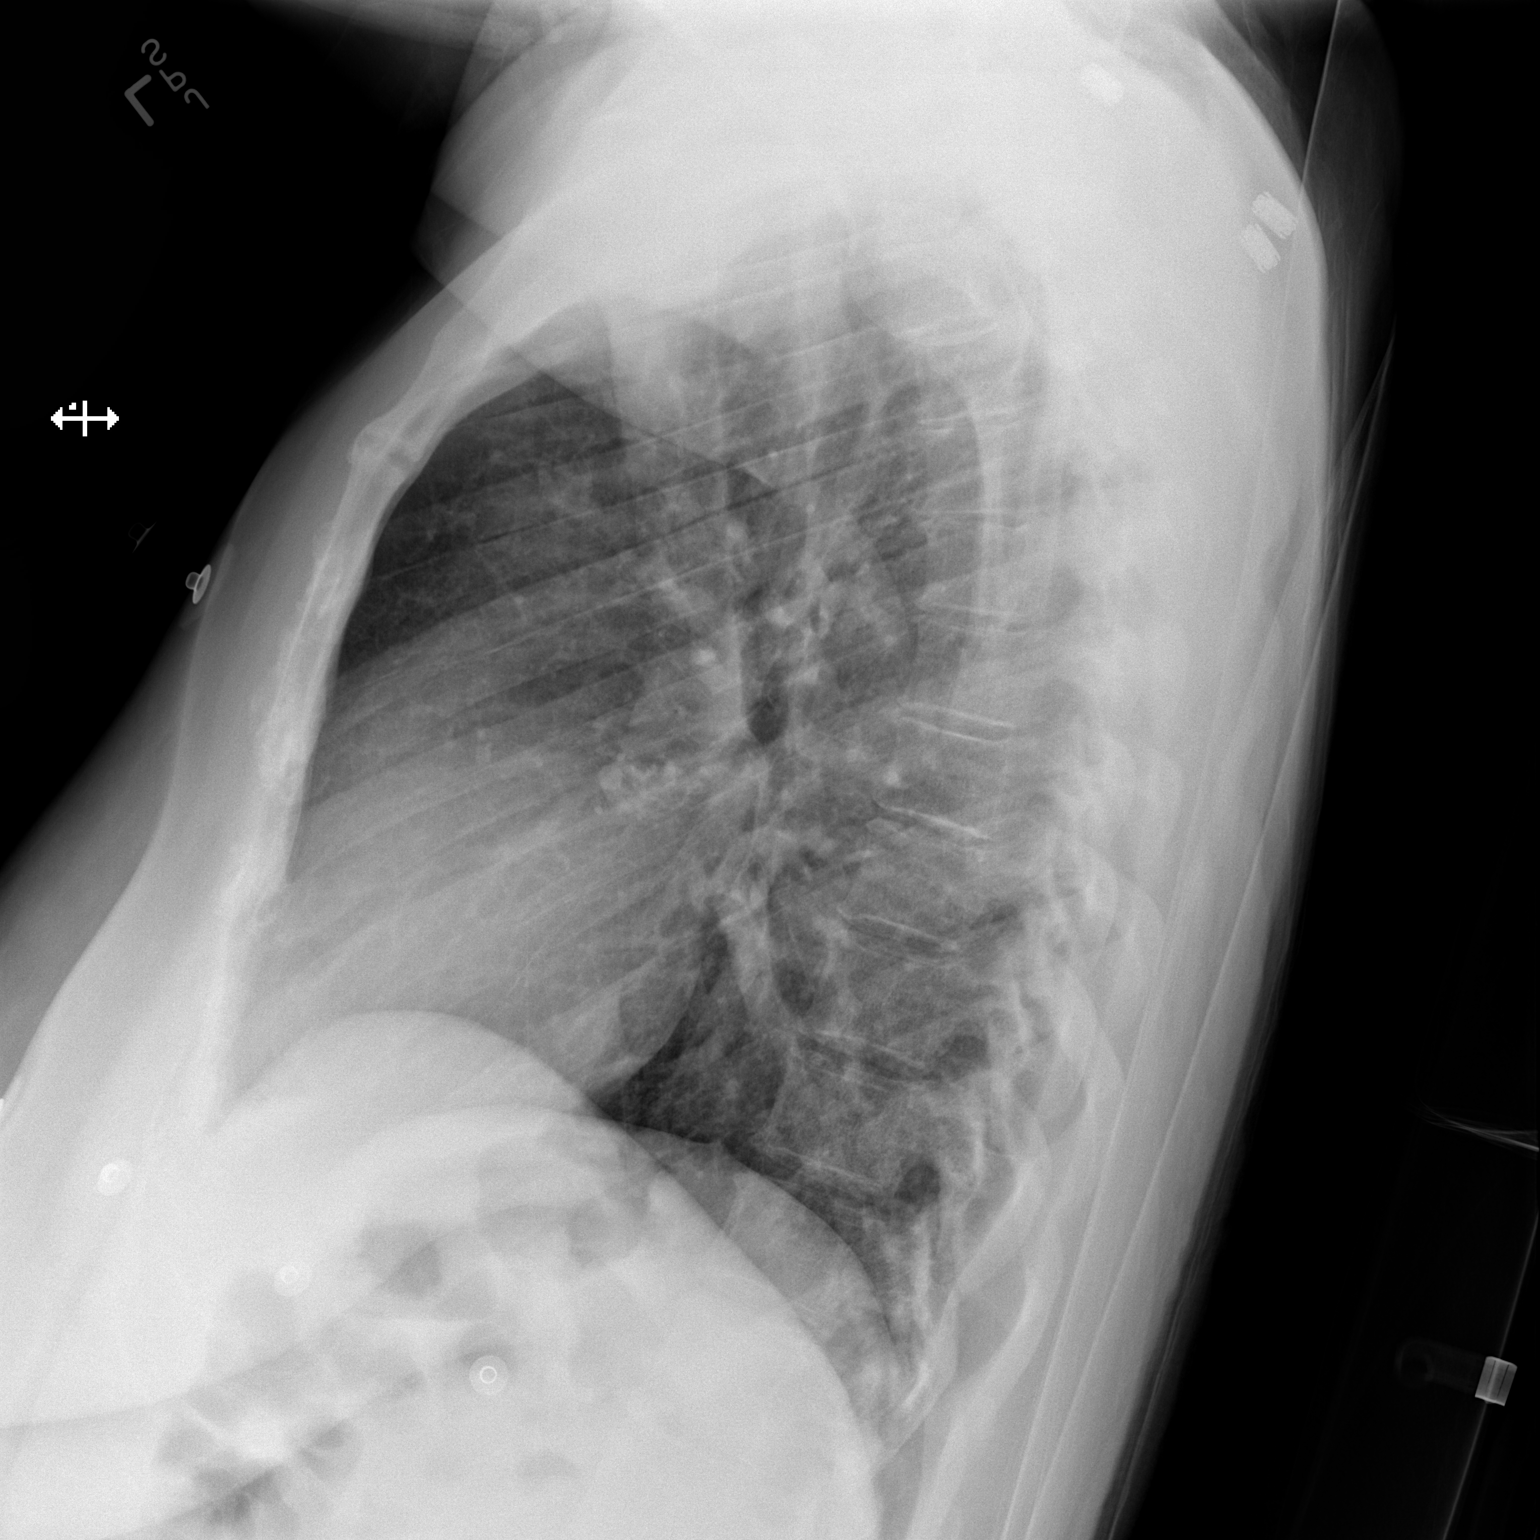

[w chest pa]
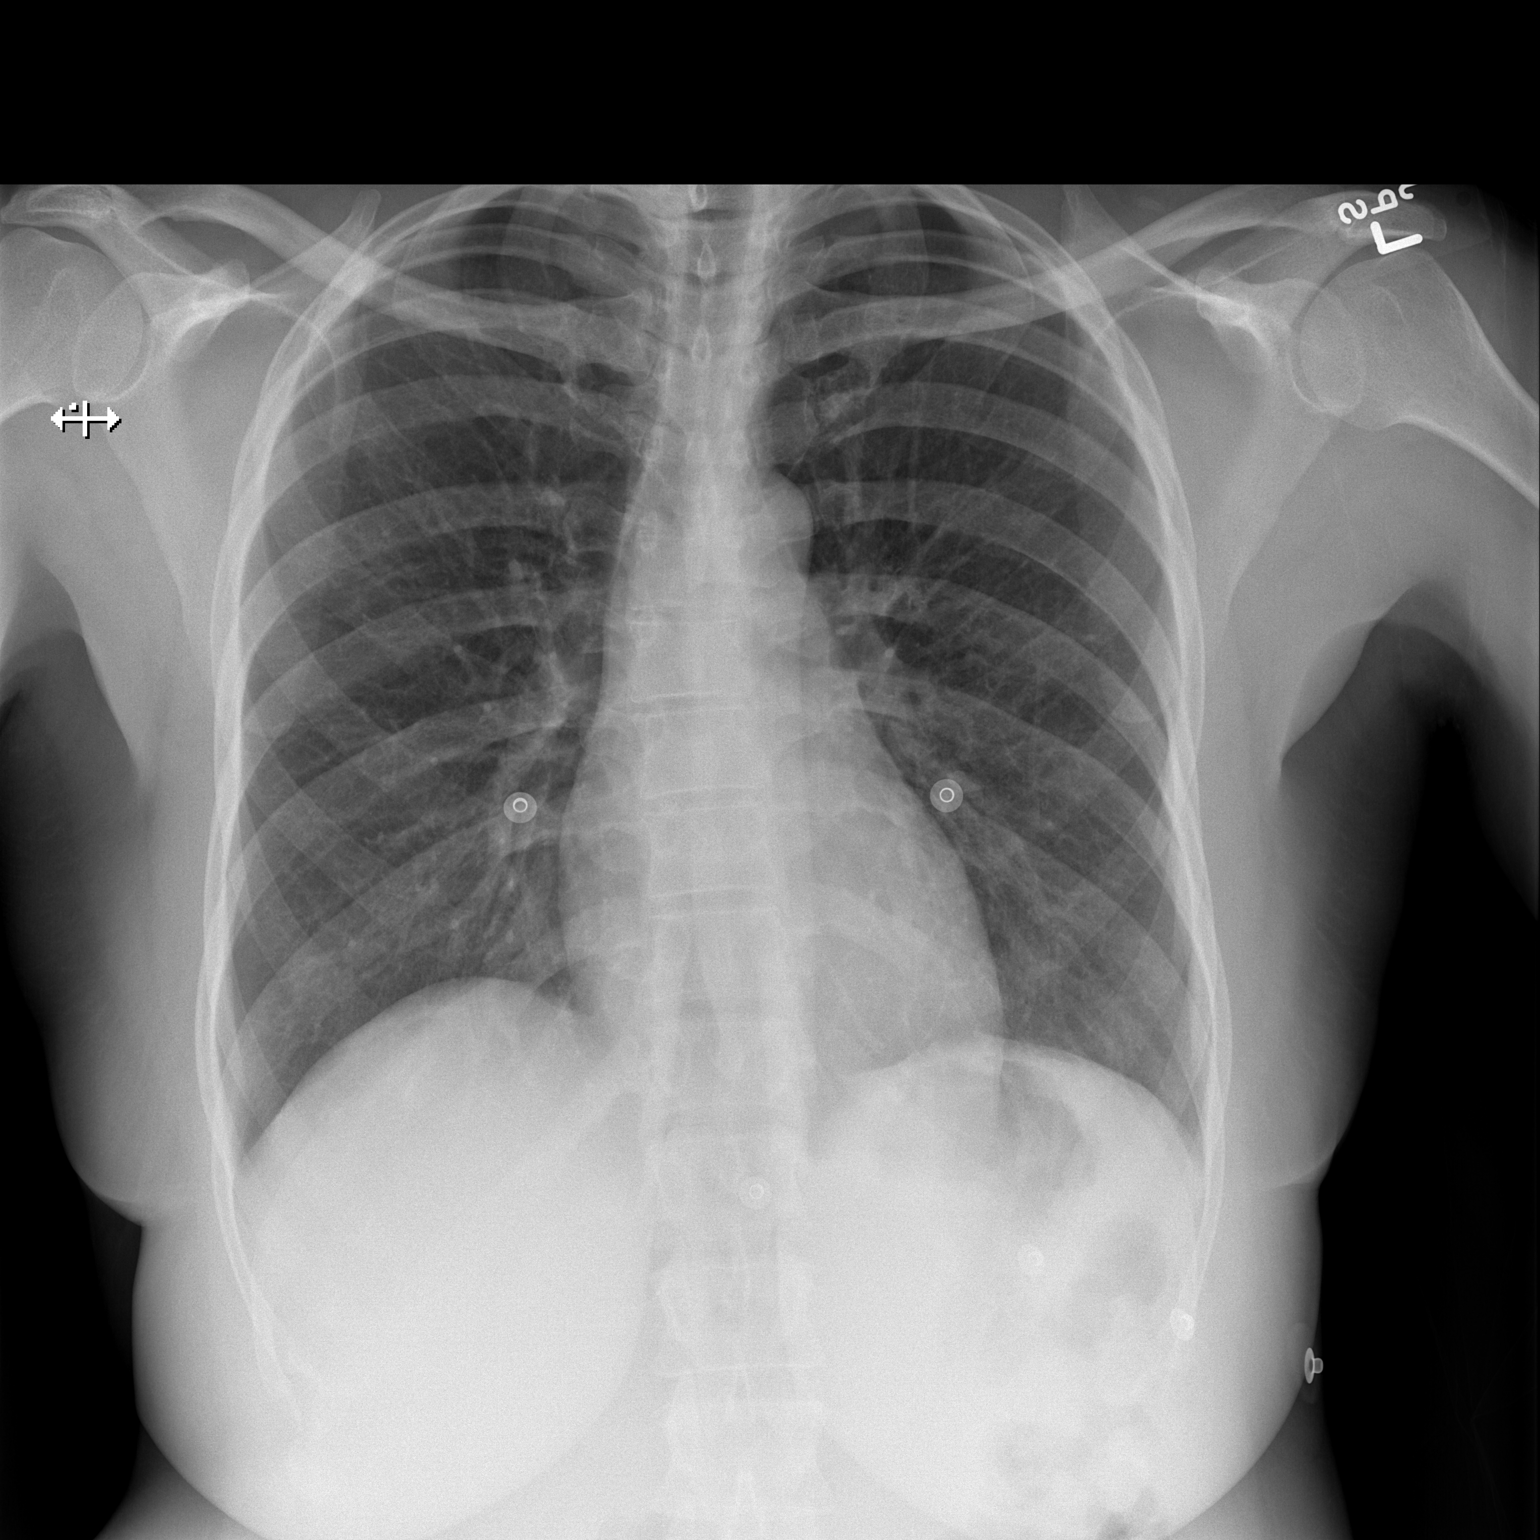

[2 of 2 positions shown; findings below may reference images not displayed]

FINDINGS: Midline trachea.  Normal heart size and mediastinal contours.

Sharp costophrenic angles.  No pneumothorax.  Clear lungs.

Minimal S-shaped thoracolumbar spine curvature.
IMPRESSION: No active cardiopulmonary disease.

## 2021-06-15 ENCOUNTER — Emergency Department (HOSPITAL_COMMUNITY)
Admission: EM | Admit: 2021-06-15 | Discharge: 2021-06-15 | Disposition: A | Discharge status: Home / Self Care | Payer: 59 | Attending: Emergency Medicine | Admitting: Emergency Medicine

## 2021-06-15 ENCOUNTER — Encounter (HOSPITAL_COMMUNITY): Payer: Self-pay

## 2021-06-15 DIAGNOSIS — R220 Localized swelling, mass and lump, head: Secondary | ICD-10-CM | POA: Insufficient documentation

## 2021-06-15 DIAGNOSIS — Z5321 Procedure and treatment not carried out due to patient leaving prior to being seen by health care provider: Secondary | ICD-10-CM | POA: Insufficient documentation

## 2021-06-15 LAB — BASIC METABOLIC PANEL
Anion gap: 8 (ref 5–15)
BUN: 12 mg/dL (ref 6–20)
CO2: 24 mmol/L (ref 22–32)
Calcium: 9.2 mg/dL (ref 8.9–10.3)
Chloride: 105 mmol/L (ref 98–111)
Creatinine, Ser: 0.77 mg/dL (ref 0.44–1.00)
GFR, Estimated: >60 mL/min (ref >60)
Glucose, Bld: 114 mg/dL — ABNORMAL HIGH (ref 70–99)
Potassium: 3.7 mmol/L (ref 3.5–5.1)
Sodium: 137 mmol/L (ref 135–145)

## 2021-06-15 LAB — CBC
HCT: 37.1 % (ref 36.0–46.0)
Hemoglobin: 12.1 g/dL (ref 12.0–15.0)
MCH: 25.7 pg — ABNORMAL LOW (ref 26.0–34.0)
MCHC: 32.6 g/dL (ref 30.0–36.0)
MCV: 78.9 fL — ABNORMAL LOW (ref 80.0–100.0)
Platelets: 230 10*3/uL (ref 150–400)
RBC: 4.7 MIL/uL (ref 3.87–5.11)
RDW: 13.7 % (ref 11.5–15.5)
WBC: 4.3 10*3/uL (ref 4.0–10.5)
nRBC: 0 % (ref 0.0–0.2)

## 2021-06-15 NOTE — ED Triage Notes (Addendum)
Patient arrives from home with complaint or oral and facial swelling that began Friday. Patient's lips and right side of face visibly swollen upon triage. Pt denies any new foods or medication, states swelling has been increasing since Friday.

## 2022-08-08 ENCOUNTER — Other Ambulatory Visit: Payer: Self-pay | Admitting: Obstetrics and Gynecology

## 2022-08-08 DIAGNOSIS — R928 Other abnormal and inconclusive findings on diagnostic imaging of breast: Secondary | ICD-10-CM

## 2022-08-31 ENCOUNTER — Other Ambulatory Visit: Payer: Self-pay

## 2022-09-02 ENCOUNTER — Other Ambulatory Visit: Payer: Self-pay | Admitting: Obstetrics and Gynecology

## 2022-09-02 DIAGNOSIS — E221 Hyperprolactinemia: Secondary | ICD-10-CM

## 2022-09-14 ENCOUNTER — Ambulatory Visit: Admission: RE | Admit: 2022-09-14 | Payer: Self-pay | Source: Ambulatory Visit

## 2022-09-14 ENCOUNTER — Ambulatory Visit
Admission: RE | Admit: 2022-09-14 | Discharge: 2022-09-14 | Disposition: A | Discharge status: Home / Self Care | Payer: No Typology Code available for payment source | Source: Ambulatory Visit | Attending: Obstetrics and Gynecology | Admitting: Obstetrics and Gynecology

## 2022-09-14 ENCOUNTER — Other Ambulatory Visit: Payer: Self-pay | Admitting: Obstetrics and Gynecology

## 2022-09-14 DIAGNOSIS — R928 Other abnormal and inconclusive findings on diagnostic imaging of breast: Secondary | ICD-10-CM

## 2022-09-14 DIAGNOSIS — R921 Mammographic calcification found on diagnostic imaging of breast: Secondary | ICD-10-CM

## 2022-10-20 ENCOUNTER — Ambulatory Visit
Admission: RE | Admit: 2022-10-20 | Discharge: 2022-10-20 | Disposition: A | Discharge status: Home / Self Care | Payer: No Typology Code available for payment source | Source: Ambulatory Visit | Attending: Obstetrics and Gynecology | Admitting: Obstetrics and Gynecology

## 2022-10-20 DIAGNOSIS — R921 Mammographic calcification found on diagnostic imaging of breast: Secondary | ICD-10-CM

## 2022-10-20 HISTORY — PX: BREAST BIOPSY: SHX20

## 2022-12-07 ENCOUNTER — Other Ambulatory Visit (HOSPITAL_COMMUNITY): Payer: Self-pay | Admitting: Obstetrics and Gynecology

## 2022-12-07 DIAGNOSIS — E221 Hyperprolactinemia: Secondary | ICD-10-CM

## 2022-12-17 ENCOUNTER — Ambulatory Visit (HOSPITAL_COMMUNITY): Payer: 59

## 2022-12-17 ENCOUNTER — Encounter (HOSPITAL_COMMUNITY): Payer: Self-pay

## 2024-04-30 ENCOUNTER — Ambulatory Visit: Admitting: Family Medicine

## 2024-05-13 ENCOUNTER — Ambulatory Visit: Admitting: Family Medicine

## 2024-06-04 ENCOUNTER — Ambulatory Visit: Admitting: Family Medicine

## 2024-07-17 ENCOUNTER — Ambulatory Visit: Admitting: Family Medicine

## 2024-08-27 ENCOUNTER — Ambulatory Visit: Admitting: Family Medicine
